# Patient Record
Sex: Female | Born: 1995 | Race: Black or African American | Hispanic: No | Marital: Single | Smoking: Never smoker
Health system: Southern US, Community
[De-identification: ages and names within clinical notes are randomized; demographics above are authoritative.]

---

## 2017-05-15 ENCOUNTER — Ambulatory Visit (INDEPENDENT_AMBULATORY_CARE_PROVIDER_SITE_OTHER): Payer: Medicaid Other

## 2017-05-15 ENCOUNTER — Encounter (HOSPITAL_COMMUNITY): Payer: Self-pay | Admitting: Emergency Medicine

## 2017-05-15 ENCOUNTER — Ambulatory Visit (HOSPITAL_COMMUNITY)
Admission: EM | Admit: 2017-05-15 | Discharge: 2017-05-15 | Disposition: A | Discharge status: Home / Self Care | Payer: Medicaid Other | Attending: Family Medicine | Admitting: Family Medicine

## 2017-05-15 DIAGNOSIS — W19XXXA Unspecified fall, initial encounter: Secondary | ICD-10-CM | POA: Diagnosis not present

## 2017-05-15 DIAGNOSIS — S92353A Displaced fracture of fifth metatarsal bone, unspecified foot, initial encounter for closed fracture: Secondary | ICD-10-CM | POA: Diagnosis not present

## 2017-05-15 DIAGNOSIS — S92351A Displaced fracture of fifth metatarsal bone, right foot, initial encounter for closed fracture: Secondary | ICD-10-CM

## 2017-05-15 NOTE — ED Triage Notes (Signed)
Right foot pain, swelling in foot.  Right foot rolled laterally.  Swelling and bruise visible on top of foot.  Pedal pulses 2 +.  Able to move great toe, but not the other toes.

## 2017-05-15 NOTE — ED Notes (Signed)
Ortho tech is coming for splint application

## 2017-05-15 NOTE — Discharge Instructions (Signed)
No weightbearing. Use crutches. Keep the foot elevated. Wear the splint until you see the orthopedist early next week.

## 2017-05-15 NOTE — ED Notes (Signed)
Ortho tech paged  

## 2017-05-15 NOTE — Progress Notes (Signed)
Orthopedic Tech Progress Note Patient Details:  Samantha Black 07/18/1996 [id number]  Ortho Devices Type of Ortho Device: Ace wrap Ortho Device/Splint Location: RLE Ortho Device/Splint Interventions: Ordered, Application   Jennye Moccasin 05/15/2017, 8:31 PM

## 2017-05-15 NOTE — ED Provider Notes (Signed)
[facility]    CSN: [id number] Arrival date & time: 05/15/17  1847     History   Chief Complaint Chief Complaint  Patient presents with  . Fall    HPI Anslea Leinberger is a 21 y.o. female.   21 year old female states she fell last night and twisted her right foot. She is complaining of pain over most of the foot primarily the lateral aspect. There is swelling to the dorsum of the foot as well as the lateral aspect. Denies injury to the ankle. She is unable to bear weight due to pain.      History reviewed. No pertinent past medical history.  There are no active problems to display for this patient.   History reviewed. No pertinent surgical history.  OB History    No data available       Home Medications    Prior to Admission medications   Not on File    Family History No family history on file.  Social History Social History  Substance Use Topics  . Smoking status: Never Smoker  . Smokeless tobacco: Not on file  . Alcohol use Yes     Allergies   Patient has no known allergies.   Review of Systems Review of Systems  Constitutional: Negative for activity change, chills and fever.  HENT: Negative.   Respiratory: Negative.   Cardiovascular: Negative.   Musculoskeletal:       As per HPI  Skin: Negative for color change, pallor and rash.  Neurological: Negative.   All other systems reviewed and are negative.    Physical Exam Triage Vital Signs ED Triage Vitals  Enc Vitals Group     BP 05/15/17 1904 (!) 116/59     Pulse Rate 05/15/17 1904 92     Resp 05/15/17 1904 18     Temp 05/15/17 1904 98.7 F (37.1 C)     Temp Source 05/15/17 1904 Oral     SpO2 05/15/17 1904 100 %     Weight --      Height --      Head Circumference --      Peak Flow --      Pain Score 05/15/17 1901 7     Pain Loc --      Pain Edu? --      Excl. in GC? --    No data found.   Updated Vital Signs BP (!) 116/59 (BP Location: Right Arm)   Pulse 92    Temp 98.7 F (37.1 C) (Oral)   Resp 18   LMP 04/05/2017   SpO2 100%   Visual Acuity Right Eye Distance:   Left Eye Distance:   Bilateral Distance:    Right Eye Near:   Left Eye Near:    Bilateral Near:     Physical Exam  Constitutional: She is oriented to person, place, and time. She appears well-developed and well-nourished. No distress.  HENT:  Head: Normocephalic and atraumatic.  Eyes: EOM are normal.  Neck: Neck supple.  Musculoskeletal: She exhibits edema and tenderness.  Decreased range of motion of the ankle and foot. Swelling primarily to the dorsum and dorsi lateral aspect of the right foot. Tenderness to the entire top of the foot as well as the lateral aspect. Pedal pulses 1+. Normal warmth. No bony tenderness to the ankle. No ankle swelling.  Neurological: She is alert and oriented to person, place, and time. No cranial nerve deficit.  Skin: Skin is warm and dry.  Nursing note and vitals reviewed.    UC Treatments / Results  Labs (all labs ordered are listed, but only abnormal results are displayed) Labs Reviewed - No data to display  EKG  EKG Interpretation None       Radiology Dg Foot Complete Right  Result Date: 05/15/2017 CLINICAL DATA:  Patient rolled right foot last evening.  Foot pain. EXAM: RIGHT FOOT COMPLETE - 3+ VIEW COMPARISON:  None. FINDINGS: There is an acute, closed minimally comminuted avulsion fracture of the fifth metatarsal base. Remainder of the foot appears intact. There is soft tissue swelling over the dorsum and lateral aspect of the midfoot. IMPRESSION: Acute, minimally comminuted, closed avulsion fracture involving the base of the fifth metatarsal. Electronically Signed   By: Tollie Eth M.D.   On: 05/15/2017 19:43    Procedures Procedures (including critical care time)  Medications Ordered in UC Medications - No data to display   Initial Impression / Assessment and Plan / UC Course  I have reviewed the triage vital signs  and the nursing notes.  Pertinent labs & imaging results that were available during my care of the patient were reviewed by me and considered in my medical decision making (see chart for details).     No weightbearing. Use crutches. Keep the foot elevated. Wear the splint until you see the orthopedist early next week.   Final Clinical Impressions(s) / UC Diagnoses   Final diagnoses:  Closed fracture of base of fifth metatarsal bone    New Prescriptions New Prescriptions   No medications on file     Controlled Substance Prescriptions [place] Controlled Substance Registry consulted? Not Applicable   Hayden Rasmussen, NP 05/15/17 2003

## 2017-05-15 NOTE — ED Notes (Signed)
Ortho tech present to apply splint.

## 2017-05-20 ENCOUNTER — Encounter (INDEPENDENT_AMBULATORY_CARE_PROVIDER_SITE_OTHER): Payer: Self-pay | Admitting: Orthopaedic Surgery

## 2017-05-20 ENCOUNTER — Ambulatory Visit (INDEPENDENT_AMBULATORY_CARE_PROVIDER_SITE_OTHER): Payer: Medicaid Other | Admitting: Orthopaedic Surgery

## 2017-05-20 VITALS — BP 107/56 | HR 64 | Ht 65.0 in

## 2017-05-20 DIAGNOSIS — S92354A Nondisplaced fracture of fifth metatarsal bone, right foot, initial encounter for closed fracture: Secondary | ICD-10-CM | POA: Diagnosis not present

## 2017-05-20 NOTE — Progress Notes (Signed)
   Office Visit Note   Patient: Laurian Manchego           Date of Birth: 25-Sep-1996           MRN: [id number] Visit Date: 05/20/2017              Requested by: No referring provider defined for this encounter. PCP: Patient, No Pcp Per   Assessment & Plan: Visit Diagnoses:  1. Nondisplaced fracture of fifth metatarsal bone, right foot, initial encounter for closed fracture     Plan: Cam boot. She asked about a knee scooter which she can reinitiate so desires. Plan recheck her back again in 4 weeks for x-rays out of the cam boot. We discussed elevation and anti-inflammatory. Tylenol No. 3 for severe pain.  Follow-Up Instructions: Return in about 4 weeks (around 06/17/2017).   Orders:  No orders of the defined types were placed in this encounter.  No orders of the defined types were placed in this encounter.     Procedures: No procedures performed   Clinical Data: No additional findings.   Subjective: Chief Complaint  Patient presents with  . Right Foot - Fracture    HPI 21 year old female CG still was walking she turned her ankle was acute right foot pain and fell. She was seen had x-rays taken which showed a minimally displaced right fifth metatarsal fracture.  Review of Systems patient's had a previous breast biopsy which was benign otherwise she's been healthy.   Objective: Vital Signs: BP (!) 107/56   Pulse 64   Ht 5\' 5"  (1.651 m)   Physical Exam  Constitutional: She is oriented to person, place, and time. She appears well-developed.  HENT:  Head: Normocephalic.  Right Ear: External ear normal.  Left Ear: External ear normal.  Eyes: Pupils are equal, round, and reactive to light.  Neck: No tracheal deviation present. No thyromegaly present.  Cardiovascular: Normal rate.   Pulmonary/Chest: Effort normal.  Abdominal: Soft.  Musculoskeletal:  Patient's forefoot swelling. Tenderness over the base of the fifth. This is a closed injury plantar surface of her  foot is normal pulses are normal.  Neurological: She is alert and oriented to person, place, and time.  Skin: Skin is warm and dry.  Psychiatric: She has a normal mood and affect. Her behavior is normal.    Ortho Exam  Specialty Comments:  No specialty comments available.  Imaging: No results found.   PMFS History: There are no active problems to display for this patient.  No past medical history on file.  No family history on file.  No past surgical history on file. Social History   Occupational History  . Not on file.   Social History Main Topics  . Smoking status: Never Smoker  . Smokeless tobacco: Never Used  . Alcohol use Yes  . Drug use: No  . Sexual activity: Not on file

## 2017-05-24 ENCOUNTER — Telehealth (INDEPENDENT_AMBULATORY_CARE_PROVIDER_SITE_OTHER): Payer: Self-pay

## 2017-05-24 MED ORDER — ACETAMINOPHEN-CODEINE #3 300-30 MG PO TABS: 1.0000 | ORAL_TABLET | Freq: Three times a day (TID) | ORAL | 0 refills | Status: AC | PRN

## 2017-05-24 NOTE — Telephone Encounter (Signed)
Please advise. Patient has minimally displaced 5th metatarsal fracture.

## 2017-05-24 NOTE — Telephone Encounter (Signed)
Called to pharmacy 

## 2017-05-24 NOTE — Telephone Encounter (Signed)
Call in tylenol # 3     One po tid prn # 20   Thank you

## 2017-05-24 NOTE — Addendum Note (Signed)
Addended by: Rogers Seeds on: 05/24/2017 05:03 PM   Modules accepted: Orders

## 2017-05-24 NOTE — Telephone Encounter (Signed)
Patient would like a Rx for pain.  Stated that right foot hurts while holding foot down and has a hard time putting on boot.  CB# is [phone].  Please advise.

## 2017-05-25 ENCOUNTER — Telehealth (INDEPENDENT_AMBULATORY_CARE_PROVIDER_SITE_OTHER): Payer: Self-pay

## 2017-05-25 NOTE — Telephone Encounter (Signed)
Medication has been called to the pharmacy 

## 2017-05-25 NOTE — Telephone Encounter (Signed)
Patient would like for Rx to be sent to [facility] in [place].  Cb# is [phone].  Please advise.  Thank you.

## 2017-06-21 ENCOUNTER — Ambulatory Visit (INDEPENDENT_AMBULATORY_CARE_PROVIDER_SITE_OTHER): Payer: Medicaid Other | Admitting: Orthopaedic Surgery

## 2017-06-21 ENCOUNTER — Encounter (INDEPENDENT_AMBULATORY_CARE_PROVIDER_SITE_OTHER): Payer: Self-pay | Admitting: Orthopaedic Surgery

## 2017-06-21 ENCOUNTER — Ambulatory Visit (INDEPENDENT_AMBULATORY_CARE_PROVIDER_SITE_OTHER): Payer: Medicaid Other

## 2017-06-21 VITALS — Ht 65.0 in

## 2017-06-21 DIAGNOSIS — S92354D Nondisplaced fracture of fifth metatarsal bone, right foot, subsequent encounter for fracture with routine healing: Secondary | ICD-10-CM

## 2017-06-21 NOTE — Progress Notes (Signed)
   Office Visit Note   Patient: Samantha Black           Date of Birth: 1995-10-24           MRN: [id number] Visit Date: 06/21/2017              Requested by: No referring provider defined for this encounter. PCP: Patient, No Pcp Per   Assessment & Plan: Visit Diagnoses:  1. Closed nondisplaced fracture of fifth metatarsal bone of right foot with routine healing, subsequent encounter     Plan: She will wean herself out of the cam boot back into tennis shoes gradually resume activities when on sports for several weeks and then gradually ramp up to normal activity. Follow-up here will be on an as-needed basis.  Follow-Up Instructions: No Follow-up on file.   Orders:  Orders Placed This Encounter  Procedures  . XR Foot Complete Right   No orders of the defined types were placed in this encounter.     Procedures: No procedures performed   Clinical Data: No additional findings.   Subjective: Chief Complaint  Patient presents with  . Right Foot - Follow-up, Fracture    HPI 21 year old female returns post right fifth metatarsal base fracture. She's been in a cam boot [employer] 05/14/2017. She still has some mild swelling of foot she is able to stand and ambulate without the boot. X-rays today demonstrate interval healing.  Review of Systems unchanged from last office visit.   Objective: Vital Signs: Ht  (1.651 m)   Physical Exam  Constitutional: She is oriented to person, place, and time. She appears well-developed.  HENT:  Head: Normocephalic.  Right Ear: External ear normal.  Left Ear: External ear normal.  Eyes: Pupils are equal, round, and reactive to light.  Neck: No tracheal deviation present. No thyromegaly present.  Cardiovascular: Normal rate.   Pulmonary/Chest: Effort normal.  Abdominal: Soft.  Neurological: She is alert and oriented to person, place, and time.  Skin: Skin is warm and dry.  Psychiatric: She has a normal mood and affect. Her  behavior is normal.    Ortho Exam mild forefoot swelling no significant tenderness of the fracture site pulses are intact full knee range of motion.  Specialty Comments:  No specialty comments available.  Imaging: Xr Foot Complete Right  Result Date: 06/21/2017 Three-view x-rays right foot obtained and reviewed that shows proximal fifth metatarsal fracture with interval healing. Impression: Healing right fifth metatarsal fracture    PMFS History: There are no active problems to display for this patient.  No past medical history on file.  No family history on file.  No past surgical history on file. Social History   Occupational History  . Not on file.   Social History Main Topics  . Smoking status: Never Smoker  . Smokeless tobacco: Never Used  . Alcohol use Yes  . Drug use: No  . Sexual activity: Not on file

## 2018-10-19 IMAGING — DX DG FOOT COMPLETE 3+V*R*
3 series · 3 of 3 positions shown · non-contrast
Comparison: None.

CLINICAL DATA: Patient rolled right foot last evening.  Foot pain.

EXAM:
RIGHT FOOT COMPLETE - 3+ VIEW

[foot ap]
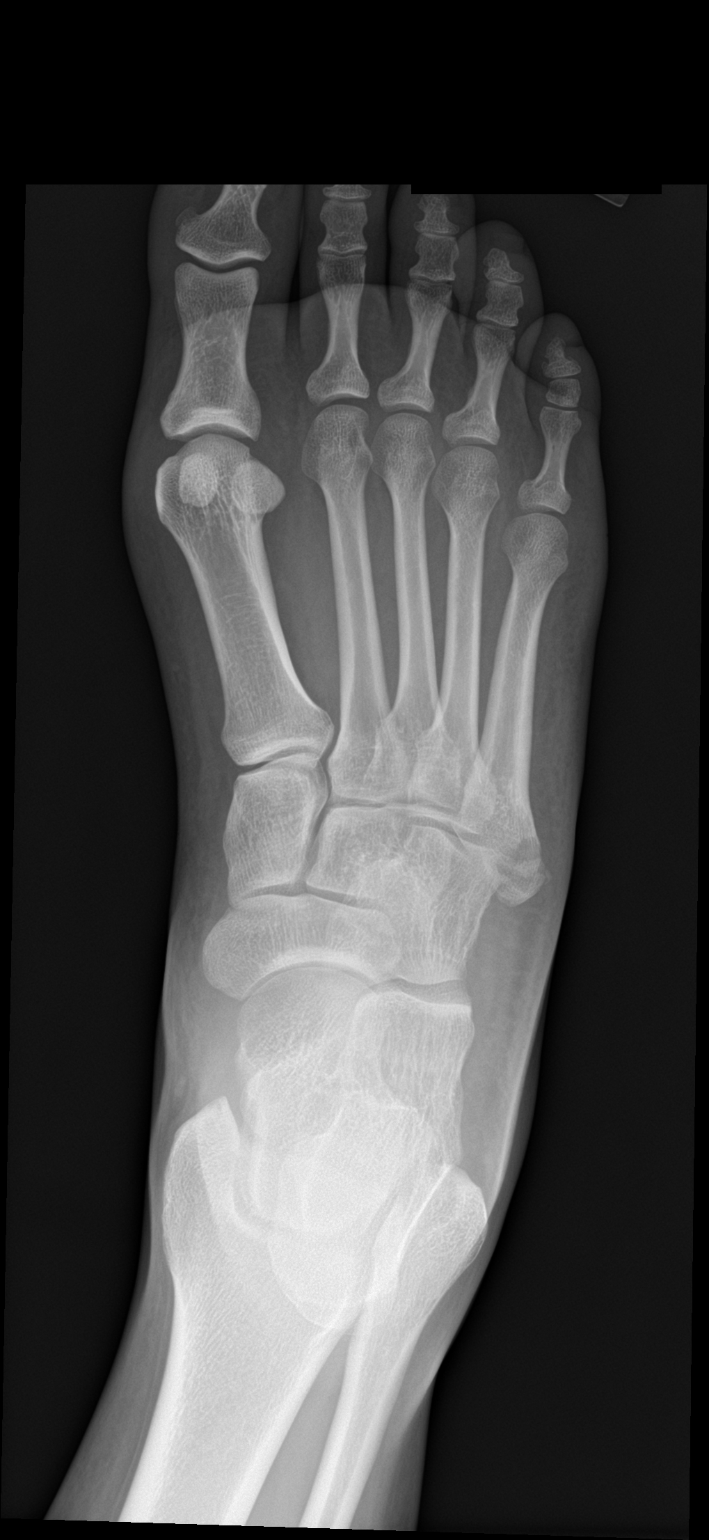

[foot obl]
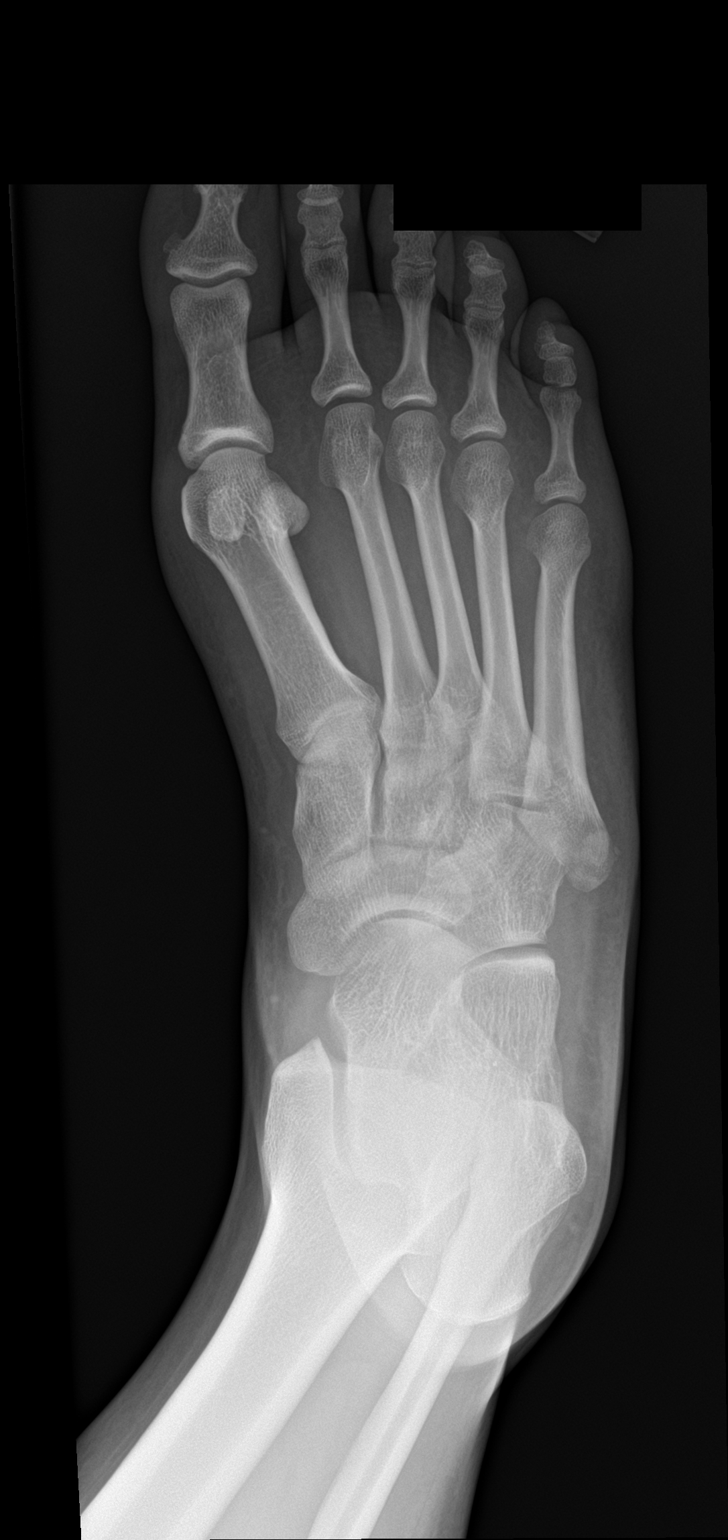

[foot lat]
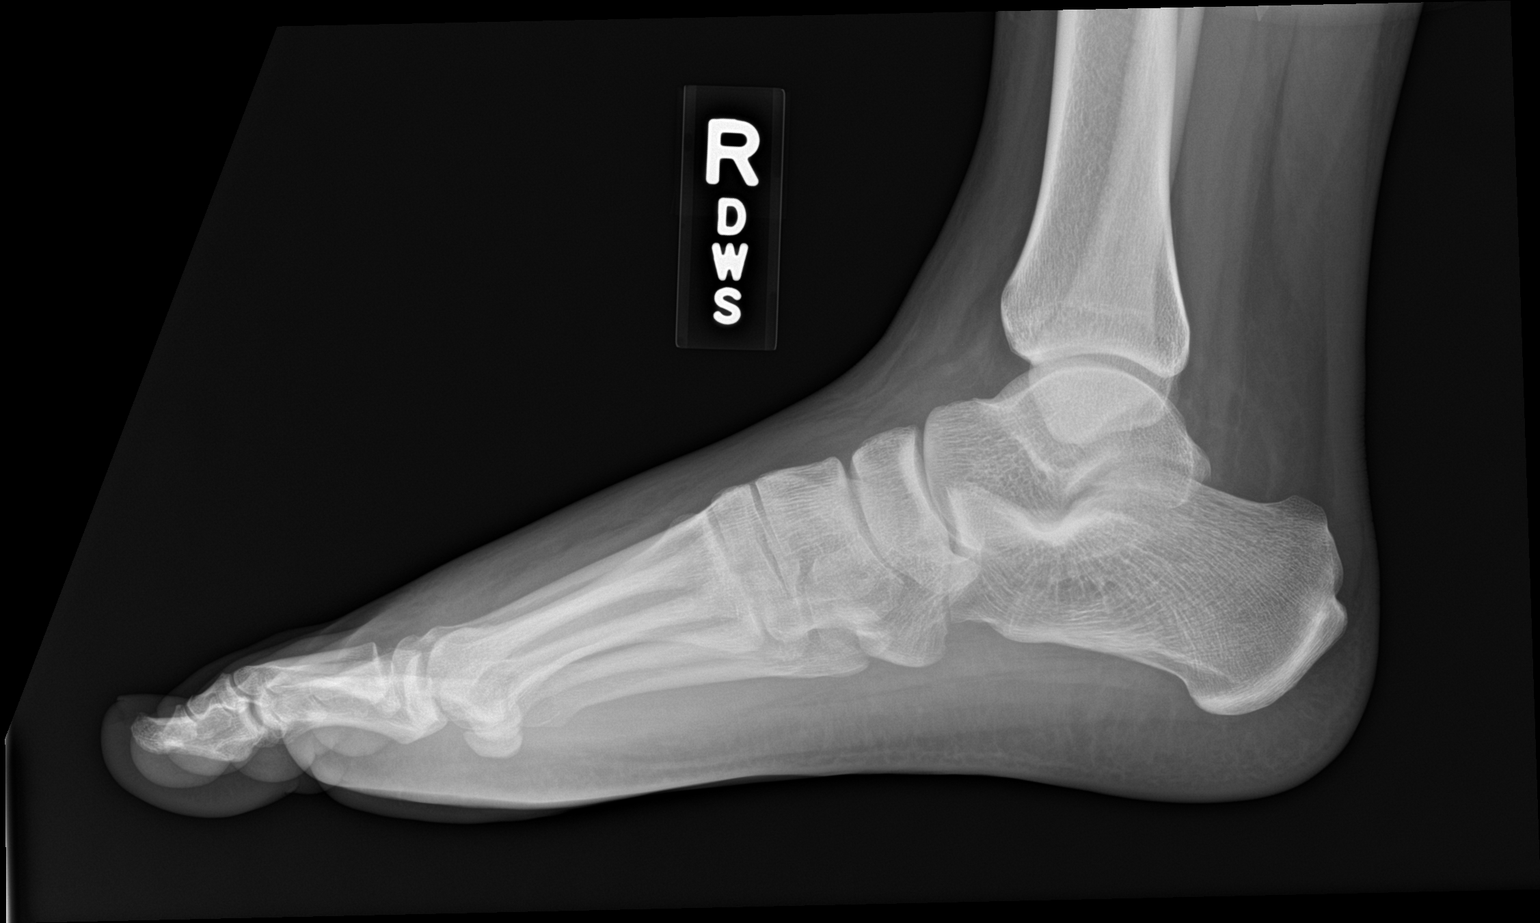

[3 of 3 positions shown; findings below may reference images not displayed]

FINDINGS: There is an acute, closed minimally comminuted avulsion fracture of
the fifth metatarsal base. Remainder of the foot appears intact.
There is soft tissue swelling over the dorsum and lateral aspect of
the midfoot.
IMPRESSION: Acute, minimally comminuted, closed avulsion fracture involving the
base of the fifth metatarsal.
